# Patient Record
Sex: Male | Born: 1985 | Race: Black or African American | Hispanic: No | Marital: Single | Smoking: Never smoker
Health system: Southern US, Community
[De-identification: ages and names within clinical notes are randomized; demographics above are authoritative.]

---
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
## 2006-03-10 ENCOUNTER — Emergency Department (HOSPITAL_COMMUNITY): Admission: EM | Admit: 2006-03-10 | Discharge: 2006-03-11 | Payer: Self-pay | Admitting: Emergency Medicine

## 2013-05-01 ENCOUNTER — Encounter (HOSPITAL_COMMUNITY): Payer: Self-pay | Admitting: Emergency Medicine

## 2013-05-01 ENCOUNTER — Emergency Department (HOSPITAL_COMMUNITY)
Admission: EM | Admit: 2013-05-01 | Discharge: 2013-05-01 | Disposition: A | Discharge status: Home / Self Care | Payer: Self-pay | Attending: Emergency Medicine | Admitting: Emergency Medicine

## 2013-05-01 ENCOUNTER — Emergency Department (HOSPITAL_COMMUNITY): Payer: Self-pay

## 2013-05-01 DIAGNOSIS — R042 Hemoptysis: Secondary | ICD-10-CM

## 2013-05-01 DIAGNOSIS — J029 Acute pharyngitis, unspecified: Secondary | ICD-10-CM | POA: Insufficient documentation

## 2013-05-01 DIAGNOSIS — R110 Nausea: Secondary | ICD-10-CM | POA: Insufficient documentation

## 2013-05-01 DIAGNOSIS — R059 Cough, unspecified: Secondary | ICD-10-CM

## 2013-05-01 DIAGNOSIS — R0981 Nasal congestion: Secondary | ICD-10-CM

## 2013-05-01 DIAGNOSIS — R05 Cough: Secondary | ICD-10-CM

## 2013-05-01 MED ORDER — BENZONATATE 100 MG PO CAPS: 100.0000 mg | ORAL_CAPSULE | Freq: Three times a day (TID) | ORAL | Status: DC

## 2013-05-01 MED ORDER — SALINE SPRAY 0.65 % NA SOLN: 1.0000 | NASAL | Status: DC | PRN

## 2013-05-01 MED ORDER — GUAIFENESIN 100 MG/5ML PO LIQD: 100.0000 mg | ORAL | Status: DC | PRN

## 2013-05-01 NOTE — ED Provider Notes (Signed)
CSN: [id number]     Arrival date & time 05/01/13  1948 History  This chart was scribed for non-physician practitioner Junius Finner, working with Junius Argyle, MD by Carl Best, ED Scribe. This patient was seen in room TR09C/TR09C and the patient's care was started at 8:52 PM.    Chief Complaint  Patient presents with  . Hemoptysis    The history is provided by the patient. No language interpreter was used.   HPI Comments: Shawn Clarke is a 28 y.o. male who presents to the Emergency Department complaining of constant sore throat that started a week ago.  He states that he experienced mild hemoptysis earlier today.  The patient denies night sweats, facial pain, epistaxis, and vomiting as associated symptom.  He lists nausea as an associated symptom.  The patient states that he had a 100.5 degree fever today.  He states that he experiences chest pain when he coughs.  He states that he has observed some blood in his mucous when he blows his nose.  He denies taking any medication for his symptoms.  He denies sick contacts.  He denies having a history of Asthma.   Denies recent travel.   History reviewed. No pertinent past medical history. No past surgical history on file. No family history on file. History  Substance Use Topics  . Smoking status: Not on file  . Smokeless tobacco: Not on file  . Alcohol Use: Not on file    Review of Systems  Constitutional: Positive for fever (100.5).  HENT: Positive for sore throat.   Respiratory: Positive for cough.   Cardiovascular: Positive for chest pain (when coughing).  Gastrointestinal: Positive for nausea. Negative for vomiting.  All other systems reviewed and are negative.    Allergies  Review of patient's allergies indicates no known allergies.  Home Medications   Current Outpatient Rx  Name  Route  Sig  Dispense  Refill  . benzonatate (TESSALON) 100 MG capsule   Oral   Take 1 capsule (100 mg total) by mouth every 8  (eight) hours.   21 capsule   0   . guaiFENesin (ROBITUSSIN) 100 MG/5ML liquid   Oral   Take 5-10 mLs (100-200 mg total) by mouth every 4 (four) hours as needed for cough.   60 mL   0   . sodium chloride (OCEAN) 0.65 % SOLN nasal spray   Each Nare   Place 1 spray into both nostrils as needed for congestion.   1 Bottle   0     Triage Vitals: BP 120/71  Pulse 86  Temp(Src) 99.1 F (37.3 C) (Oral)  Resp 18  Ht 5\' 11"  (1.803 m)  Wt 160 lb (72.576 kg)  BMI 22.33 kg/m2  SpO2 98%  Physical Exam  Nursing note and vitals reviewed. Constitutional: He is oriented to person, place, and time. He appears well-developed and well-nourished. No distress.  Pt lying comfortably in exam bed, NAD.   HENT:  Head: Normocephalic and atraumatic.  Right Ear: Hearing, tympanic membrane, external ear and ear canal normal.  Left Ear: Hearing, tympanic membrane, external ear and ear canal normal.  Nose: Mucosal edema present. No epistaxis. Right sinus exhibits no maxillary sinus tenderness and no frontal sinus tenderness. Left sinus exhibits no maxillary sinus tenderness and no frontal sinus tenderness.  Mouth/Throat: Uvula is midline, oropharynx is clear and moist and mucous membranes are normal.  Eyes: Conjunctivae and EOM are normal. No scleral icterus.  Neck: Normal range of motion.  Neck supple. No tracheal deviation present.  Cardiovascular: Normal rate, regular rhythm and normal heart sounds.   Pulmonary/Chest: Effort normal and breath sounds normal. No respiratory distress. He has no wheezes. He has no rales. He exhibits no tenderness.  No respiratory distress, able to speak in full sentences w/o difficulty. Lungs: CTAB.  Intermittent dry cough.  Abdominal: Soft. Bowel sounds are normal. He exhibits no distension and no mass. There is no tenderness. There is no rebound and no guarding.  Musculoskeletal: Normal range of motion.  Neurological: He is alert and oriented to person, place, and time.   Skin: Skin is warm and dry.  Psychiatric: He has a normal mood and affect. His behavior is normal.    ED Course  Procedures (including critical care time)  DIAGNOSTIC STUDIES: Oxygen Saturation is 98% on room air, normal by my interpretation.    COORDINATION OF CARE: 8:54 PM- Discussed waiting for the x-ray results to rule out Pneumonia or Bronchitis.  The patient agreed to the treatment plan.    Labs Review Labs Reviewed - No data to display Imaging Review Dg Chest 2 View  05/01/2013   CLINICAL DATA:  Sore throat and headache  EXAM: CHEST  2 VIEW  COMPARISON:  None.  FINDINGS: The heart size and mediastinal contours are within normal limits. Both lungs are clear. Scoliosis deformity involves the thoracic and lumbar spine.  IMPRESSION: No active cardiopulmonary disease.   Electronically Signed   By: Signa Kellaylor  Stroud M.D.   On: 05/01/2013 21:13    EKG Interpretation   None       MDM   1. Cough   2. Nasal congestion   3. Cough with hemoptysis    Pt presenting with cough, nasal congestion and intermittent hemoptysis. Denies recent travel or sick contacts. Denies night sweats or hx of asthma. Lungs: CTAB, no respiratory distress.  CXR: unremarkable. Rx: tessalon, tobitussin, and nasal spay. Discussed use of humidifier at night as well as keeping well hydrated. F/u with PCP.  Return precautions provided. Pt verbalized understanding and agreement with tx plan.   I personally performed the services described in this documentation, which was scribed in my presence. The recorded information has been reviewed and is accurate.    Junius Finnerrin O'Malley, PA-C 05/01/13 2254

## 2013-05-01 NOTE — ED Notes (Signed)
Presents with sore throat and cough and fever, began Sunday. Reports fever of 100.5. Reports having 3 episodes of hemoptysis since Sunday. Reports sore throat is worse with swallowing. Reports blood when blowing nose as well.

## 2013-05-02 NOTE — ED Provider Notes (Signed)
Medical screening examination/treatment/procedure(s) were performed by non-physician practitioner and as supervising physician I was immediately available for consultation/collaboration.  EKG Interpretation   None         Cassadee Vanzandt S Aarin Sparkman, MD 05/02/13 0112 

## 2014-06-03 IMAGING — CR DG CHEST 2V
2 series · 2 of 2 positions shown · non-contrast
Comparison: None.

CLINICAL DATA: Sore throat and headache

EXAM:
CHEST  2 VIEW

[w chest pa]
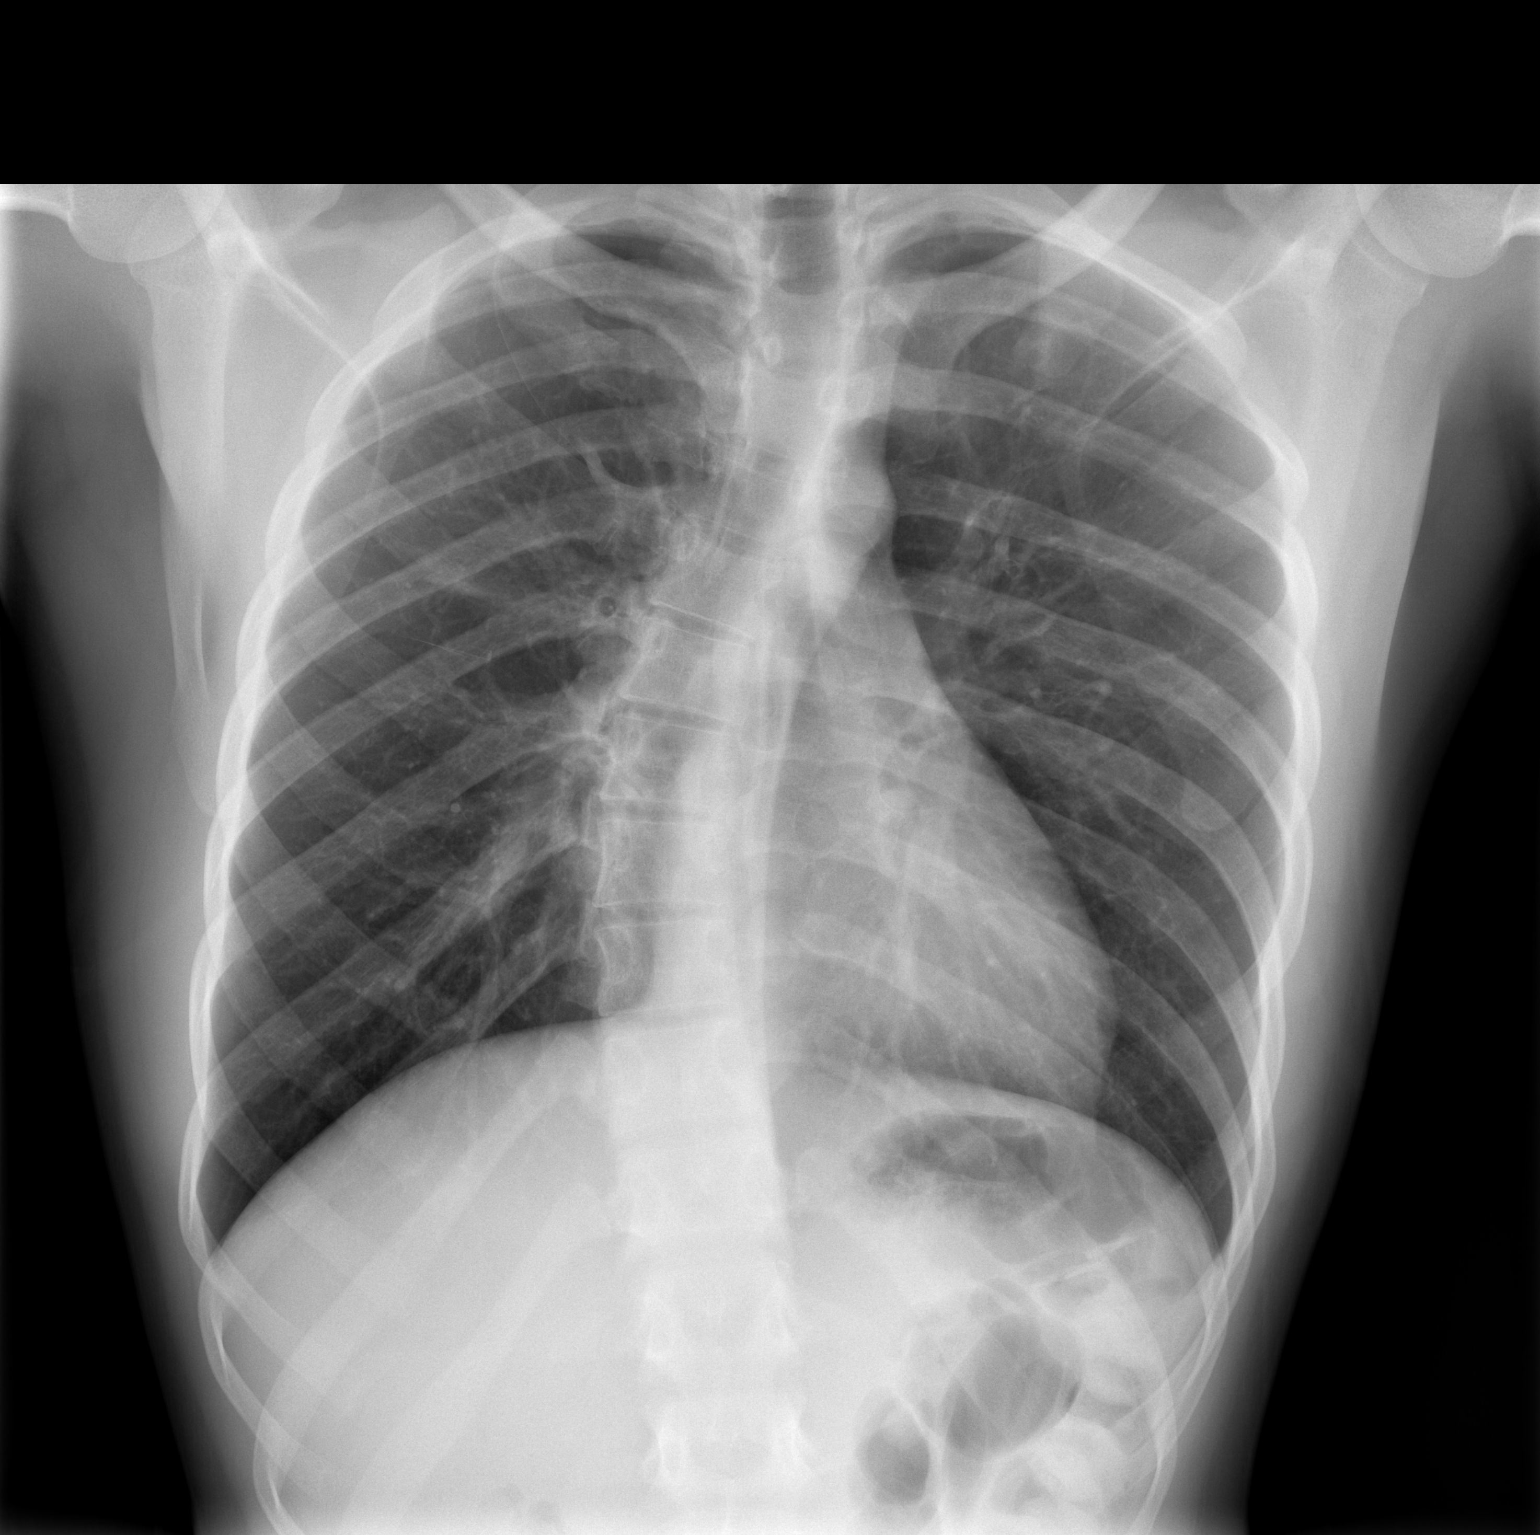

[w chest lat]
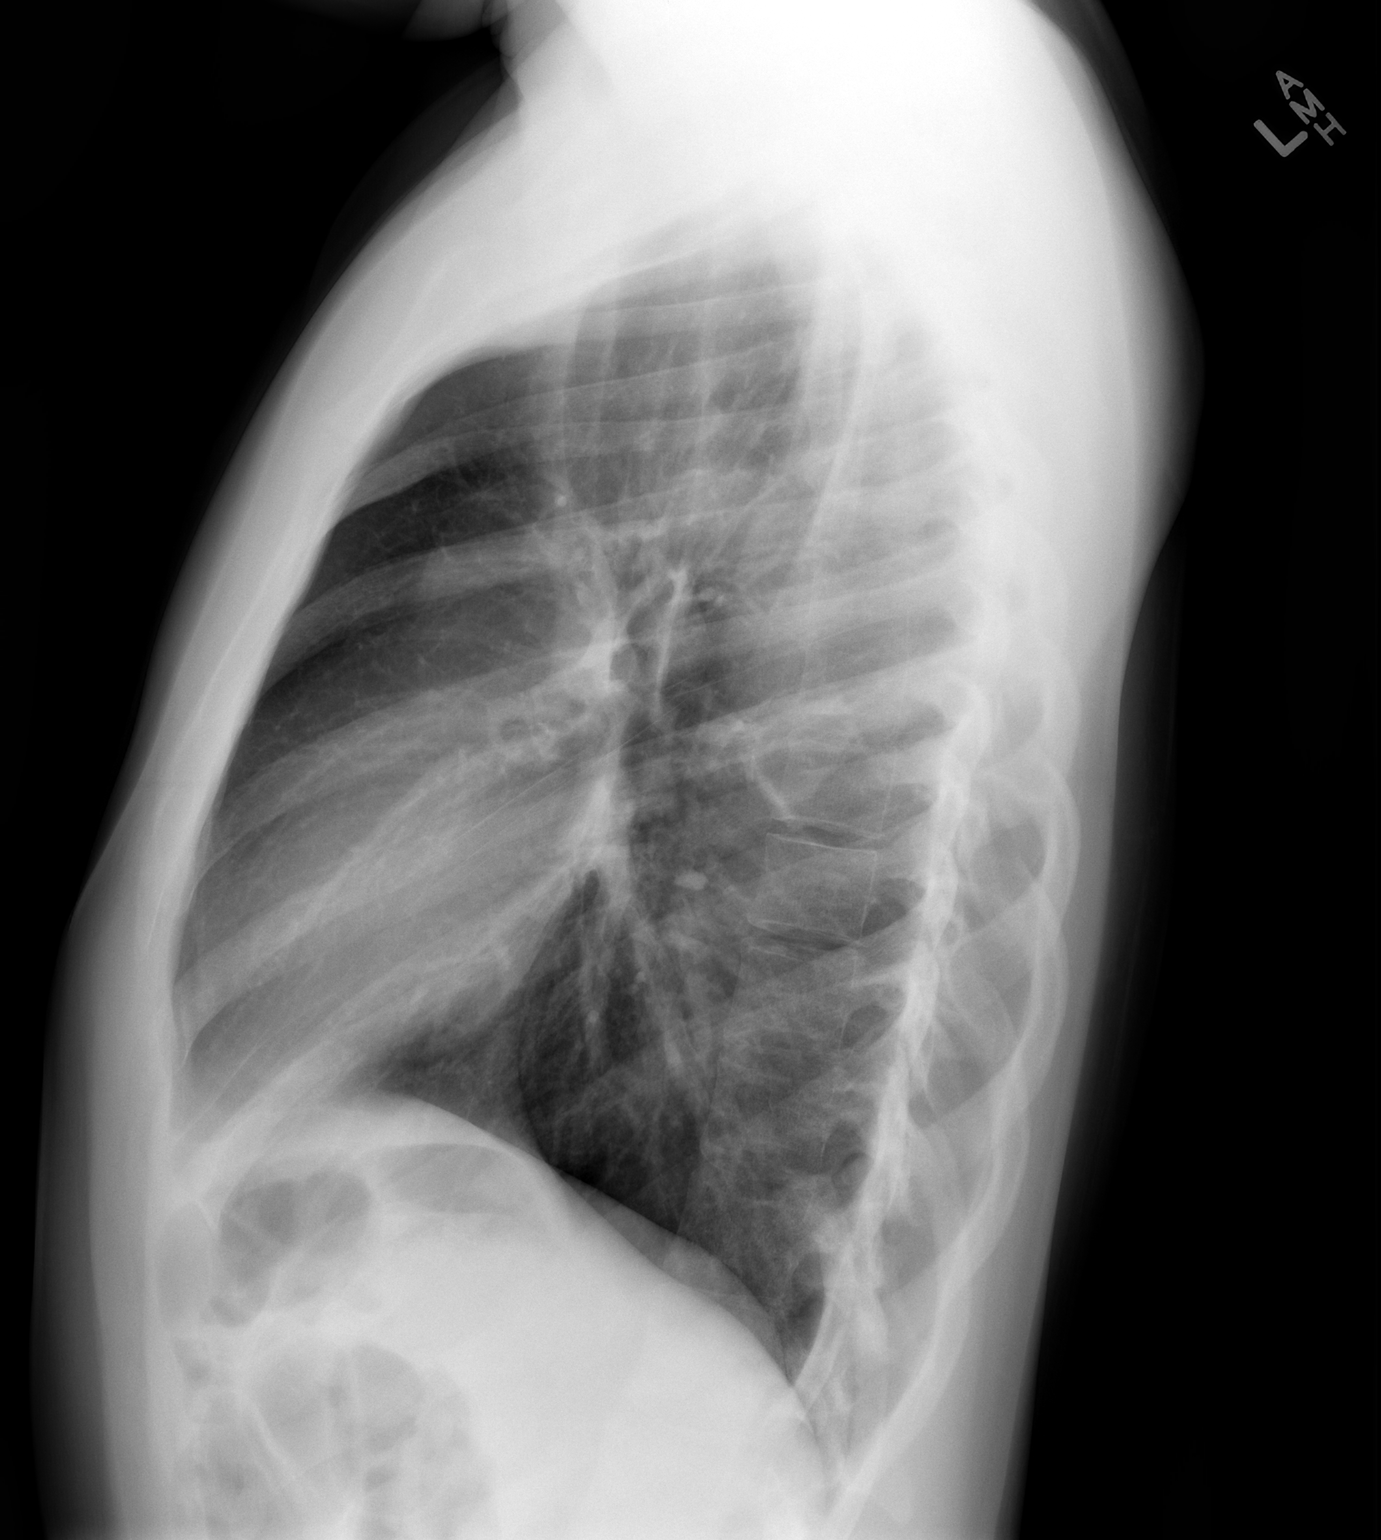

[2 of 2 positions shown; findings below may reference images not displayed]

FINDINGS: The heart size and mediastinal contours are within normal limits.
Both lungs are clear. Scoliosis deformity involves the thoracic and
lumbar spine.
IMPRESSION: No active cardiopulmonary disease.

## 2019-03-10 ENCOUNTER — Other Ambulatory Visit: Payer: Self-pay

## 2019-03-10 DIAGNOSIS — Z20822 Contact with and (suspected) exposure to covid-19: Secondary | ICD-10-CM

## 2019-03-12 LAB — NOVEL CORONAVIRUS, NAA: SARS-CoV-2, NAA: DETECTED — AB

## 2019-07-07 ENCOUNTER — Ambulatory Visit (HOSPITAL_COMMUNITY)
Admission: EM | Admit: 2019-07-07 | Discharge: 2019-07-07 | Disposition: A | Discharge status: Home / Self Care | Payer: BC Managed Care – PPO | Attending: Family Medicine | Admitting: Family Medicine

## 2019-07-07 ENCOUNTER — Encounter (HOSPITAL_COMMUNITY): Payer: Self-pay

## 2019-07-07 DIAGNOSIS — S161XXA Strain of muscle, fascia and tendon at neck level, initial encounter: Secondary | ICD-10-CM | POA: Diagnosis not present

## 2019-07-07 DIAGNOSIS — S60221A Contusion of right hand, initial encounter: Secondary | ICD-10-CM | POA: Diagnosis not present

## 2019-07-07 MED ORDER — IBUPROFEN 600 MG PO TABS: 600.0000 mg | ORAL_TABLET | Freq: Four times a day (QID) | ORAL | 0 refills | Status: AC | PRN

## 2019-07-07 MED ORDER — TIZANIDINE HCL 4 MG PO TABS: 4.0000 mg | ORAL_TABLET | Freq: Four times a day (QID) | ORAL | 0 refills | Status: DC | PRN
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
## 2019-09-27 ENCOUNTER — Ambulatory Visit: Payer: BC Managed Care – PPO | Admitting: Orthopaedic Surgery

## 2019-09-27 ENCOUNTER — Encounter: Payer: Self-pay | Admitting: Orthopaedic Surgery

## 2019-09-27 ENCOUNTER — Ambulatory Visit (INDEPENDENT_AMBULATORY_CARE_PROVIDER_SITE_OTHER): Payer: BC Managed Care – PPO

## 2019-09-27 VITALS — Ht 71.0 in | Wt 193.0 lb

## 2019-09-27 DIAGNOSIS — M79641 Pain in right hand: Secondary | ICD-10-CM
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
## 2019-10-19 ENCOUNTER — Ambulatory Visit (INDEPENDENT_AMBULATORY_CARE_PROVIDER_SITE_OTHER): Payer: BC Managed Care – PPO | Admitting: Orthopaedic Surgery

## 2019-10-19 ENCOUNTER — Encounter: Payer: Self-pay | Admitting: Orthopaedic Surgery

## 2019-10-19 DIAGNOSIS — M79641 Pain in right hand: Secondary | ICD-10-CM | POA: Diagnosis not present
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
## 2021-06-08 ENCOUNTER — Telehealth: Payer: Self-pay | Admitting: Hematology and Oncology

## 2021-06-08 NOTE — Telephone Encounter (Signed)
Scheduled appt per 3/2 referral. Pt is aware of appt date and time. Pt is aware to arrive 15 mins prior to appt time and to bring and updated insurance card. Pt is aware of appt location.   ?

## 2021-06-20 NOTE — Progress Notes (Signed)
[facility] ?CONSULT NOTE ? ?Patient Care Team: ?Norm Salt, PA as PCP - General (Physician Assistant) ? ?CHIEF COMPLAINTS/PURPOSE OF CONSULTATION: Persistent microcytosis/ hemoglobinopathy  ? ? ?HISTORY OF PRESENTING ILLNESS:  ?Shawn Clarke 36 y.o. male is here because of recent diagnosis of Persistent microcytosis hemoglobinopathy. He presents to the clinic today for evaluation and discussion.  He is in excellent health and is without any problems or concerns.  He has never had any history of anemia.  He does not remember any family members with blood disorders. ? ?I reviewed her records extensively and collaborated the history with the patient. ?  ? ?MEDICAL HISTORY:  ?No blood problems in the family ? ?SURGICAL HISTORY: ?No prior surgeries ? ?SOCIAL HISTORY: Occasional alcohol use but denies any tobacco or recreational drugs ?FAMILY HISTORY: ?Family History  ?Problem Relation Age of Onset  ? Diabetes Mother   ? Healthy Father   ? ? ?ALLERGIES:  has No Known Allergies. ? ?MEDICATIONS:  ?Current Outpatient Medications  ?Medication Sig Dispense Refill  ? ibuprofen (ADVIL) 600 MG tablet Take 1 tablet (600 mg total) by mouth every 6 (six) hours as needed. (Patient not taking: Reported on 09/27/2019) 30 tablet 0  ? tiZANidine (ZANAFLEX) 4 MG tablet Take 1-2 tablets (4-8 mg total) by mouth every 6 (six) hours as needed for muscle spasms. (Patient not taking: Reported on 09/27/2019) 21 tablet 0  ? ?No current facility-administered medications for this visit.  ? ? ?REVIEW OF SYSTEMS:   ?Constitutional: Denies fevers, chills or abnormal night sweats ?  ?All other systems were reviewed with the patient and are negative. ? ?PHYSICAL EXAMINATION: ?ECOG PERFORMANCE STATUS: 1 - Symptomatic but completely ambulatory ? ?Vitals:  ? 06/22/21 1546  ?BP: 138/82  ?Pulse: 78  ?Resp: 16  ?Temp: 98.1 ?F (36.7 ?C)  ?SpO2: 95%  ? ?Filed Weights  ? 06/22/21 1546  ?Weight: 210 lb 4.8 oz (95.4 kg)  ? ?    ? ?RADIOGRAPHIC STUDIES: ?I have personally reviewed the radiological reports and agreed with the findings in the report. ? 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 ? ?Plan: Check for hemoglobin electrophoresis and complete iron panel including ferritin ?If the electrophoresis is normal then we may have to do an alpha thalassemia genetic analysis. ? ?Telephone visit in 1 week to discuss results. ? ? ?All questions were answered. The patient knows to call the clinic with any problems, questions or concerns. ?  ? Tamsen Meek, MD ?06/22/21 ? I Janan Ridge, am acting as a scribe for Dr. Pamelia Hoit  ? ?I have reviewed the above documentation for accuracy and completeness, and I agree with the above. ? ?

## 2021-06-22 ENCOUNTER — Other Ambulatory Visit: Payer: Self-pay

## 2021-06-22 ENCOUNTER — Inpatient Hospital Stay: Payer: BC Managed Care – PPO | Attending: Hematology and Oncology | Admitting: Hematology and Oncology

## 2021-06-22 ENCOUNTER — Other Ambulatory Visit: Payer: Self-pay | Admitting: *Deleted

## 2021-06-22 ENCOUNTER — Inpatient Hospital Stay: Payer: BC Managed Care – PPO

## 2021-06-22 DIAGNOSIS — R718 Other abnormality of red blood cells: Secondary | ICD-10-CM | POA: Insufficient documentation

## 2021-06-22 LAB — IRON AND IRON BINDING CAPACITY (CC-WL,HP ONLY)
Iron: 49 ug/dL (ref 45–182)
Saturation Ratios: 14 % — ABNORMAL LOW (ref 17.9–39.5)
TIBC: 356 ug/dL (ref 250–450)
UIBC: 307 ug/dL (ref 117–376)

## 2021-06-22 NOTE — Assessment & Plan Note (Signed)
Lab review: ?05/24/2021: Iron saturation 30%, TIBC 323, hemoglobin 13.4, MCV 73.2, RDW 15.9 ?Microcytosis without any clear-cut anemia raises the suspicion that this is a hemoglobinopathy like beta thalassemia. ? ?Plan: Check for hemoglobin electrophoresis and complete iron panel including ferritin ?If the electrophoresis is normal then we may have to do an alpha thalassemia genetic analysis. ? ?Telephone visit in 1 week to discuss results. ?

## 2021-06-25 LAB — FERRITIN: Ferritin: 435 ng/mL — ABNORMAL HIGH (ref 24–336)

## 2021-06-26 LAB — HGB FRACTIONATION CASCADE
Hgb A2: 5.4 % — ABNORMAL HIGH (ref 1.8–3.2)
Hgb A: 92.5 % — ABNORMAL LOW (ref 96.4–98.8)
Hgb F: 2.1 % — ABNORMAL HIGH (ref 0.0–2.0)
Hgb S: 0 %

## 2021-06-29 NOTE — Progress Notes (Signed)
HEMATOLOGY-ONCOLOGY PROGRESS NOTE ? ?CHIEF COMPLAINTS/PURPOSE OF CONSULTATION: Persistent microcytosis/ hemoglobinopathy  ? ?History of Present Illness:  Shawn Clarke 36 y.o. male is here because of recent diagnosis of Persistent microcytosis hemoglobinopathy. He presents to the clinic today for discussion about his lab work.   ? ?REVIEW OF SYSTEMS:   ?Constitutional: Denies fevers, chills or abnormal weight loss ?All other systems were reviewed with the patient and are negative. ? ?Observations/Objective:  ?He feels reasonably well without any new problems or concerns ?  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 ?The elevated ferritin is an acute phase reactant and the patient does not need any additional work-up for hemochromatosis. ? ?Therefore patient can be seen on an as-needed basis. ? ? ? ?I discussed the assessment and treatment plan with the patient. The patient was provided an opportunity to ask questions and all were answered. The patient agreed with the plan and demonstrated an understanding of the instructions. The patient was advised to call back or seek an in-person evaluation if the symptoms worsen or if the condition fails to improve as anticipated.  ? ?I provided 15 minutes of non-face-to-face time during this encounter. Tamsen Meek, MD   ? ?I Janan Ridge am scribing for Dr. Pamelia Hoit ? ?I have reviewed the above documentation for accuracy and completeness, and I agree with the above. ?  ?

## 2021-07-02 ENCOUNTER — Other Ambulatory Visit: Payer: Self-pay

## 2021-07-02 ENCOUNTER — Inpatient Hospital Stay (HOSPITAL_BASED_OUTPATIENT_CLINIC_OR_DEPARTMENT_OTHER): Payer: BC Managed Care – PPO | Admitting: Hematology and Oncology

## 2021-07-02 DIAGNOSIS — R718 Other abnormality of red blood cells: Secondary | ICD-10-CM | POA: Diagnosis not present

## 2021-07-02 NOTE — Assessment & Plan Note (Signed)
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 ?The elevated ferritin is an acute phase reactant and the patient does not need any additional work-up for hemochromatosis. ? ?Therefore patient can be seen on an as-needed basis. ? ?

## 2022-03-23 ENCOUNTER — Ambulatory Visit
Admission: EM | Admit: 2022-03-23 | Discharge: 2022-03-23 | Disposition: A | Discharge status: Home / Self Care | Payer: BC Managed Care – PPO | Attending: Internal Medicine | Admitting: Internal Medicine

## 2022-03-23 DIAGNOSIS — Z7951 Long term (current) use of inhaled steroids: Secondary | ICD-10-CM | POA: Insufficient documentation

## 2022-03-23 DIAGNOSIS — R058 Other specified cough: Secondary | ICD-10-CM | POA: Insufficient documentation

## 2022-03-23 DIAGNOSIS — J069 Acute upper respiratory infection, unspecified: Secondary | ICD-10-CM | POA: Insufficient documentation

## 2022-03-23 DIAGNOSIS — Z79899 Other long term (current) drug therapy: Secondary | ICD-10-CM | POA: Insufficient documentation

## 2022-03-23 DIAGNOSIS — Z1152 Encounter for screening for COVID-19: Secondary | ICD-10-CM | POA: Diagnosis not present

## 2022-03-23 MED ORDER — FLUTICASONE PROPIONATE 50 MCG/ACT NA SUSP: 1.0000 | Freq: Every day | NASAL | 0 refills | Status: AC

## 2022-03-23 MED ORDER — BENZONATATE 100 MG PO CAPS: 100.0000 mg | ORAL_CAPSULE | Freq: Three times a day (TID) | ORAL | 0 refills | Status: AC | PRN

## 2022-03-23 MED ORDER — PROMETHAZINE-DM 6.25-15 MG/5ML PO SYRP: 5.0000 mL | ORAL_SOLUTION | Freq: Every evening | ORAL | 0 refills | Status: AC | PRN

## 2022-03-23 NOTE — Discharge Instructions (Signed)
You have a viral upper respiratory that should run its course and self resolve with symptomatic treatment as we discussed.  I have prescribed you 3 different medications to help alleviate symptoms.  Please be advised that Promethazine DM can cause drowsiness.  Follow-up if symptoms persist or worsen.

## 2022-03-23 NOTE — ED Triage Notes (Signed)
Pt presents to [facility] with co of cough congestion fevers for 2 days has been taking otc cold and flu medications

## 2022-03-23 NOTE — ED Provider Notes (Signed)
[facility]    CSN: [id number] Arrival date & time: 03/23/22  1444      History   Chief Complaint Chief Complaint  Patient presents with   Fever   URI    HPI Shawn Clarke is a 36 y.o. male.   Patient presents with 2-day history of cough and nasal congestion.  Tmax at home was 100.4.  His girlfriend has similar symptoms.  Has taken over-the-counter cold and flu medication with minimal to minimal improvement in symptoms.  Denies history of asthma or COPD and patient does not smoke cigarettes.  Denies chest pain, shortness of breath, sore throat, ear pain, nausea, vomiting, diarrhea, abdominal pain.   Fever URI   History reviewed. No pertinent past medical history.  Patient Active Problem List   Diagnosis Date Noted   Microcytosis 06/22/2021   Pain in right hand 10/19/2019    History reviewed. No pertinent surgical history.     Home Medications    Prior to Admission medications   Medication Sig Start Date End Date Taking? Authorizing Provider  albuterol (VENTOLIN HFA) 108 (90 Base) MCG/ACT inhaler Inhale 2 puffs into the lungs every 6 (six) hours as needed. 01/25/22  Yes [provider]  benzonatate (TESSALON) 100 MG capsule Take 1 capsule (100 mg total) by mouth every 8 (eight) hours as needed for cough. 03/23/22  Yes Jatoya Armbrister, Rolly Salter E, FNP  fluticasone (FLONASE) 50 MCG/ACT nasal spray Place 1 spray into both nostrils daily. 03/23/22  Yes Orian Figueira, Acie Fredrickson, FNP  promethazine-dextromethorphan (PROMETHAZINE-DM) 6.25-15 MG/5ML syrup Take 5 mLs by mouth at bedtime as needed for cough. 03/23/22  Yes Rayanne Padmanabhan, Rolly Salter E, FNP  rosuvastatin (CRESTOR) 10 MG tablet Take 10 mg by mouth daily. 10/19/21  Yes [provider]  ibuprofen (ADVIL) 600 MG tablet Take 1 tablet (600 mg total) by mouth every 6 (six) hours as needed. Patient not taking: Reported on 09/27/2019 07/07/19   Eustace Moore, MD  tiZANidine (ZANAFLEX) 4 MG tablet Take 1-2 tablets (4-8 mg  total) by mouth every 6 (six) hours as needed for muscle spasms. Patient not taking: Reported on 09/27/2019 07/07/19   Eustace Moore, MD  sodium chloride (OCEAN) 0.65 % SOLN nasal spray Place 1 spray into both nostrils as needed for congestion. 05/01/13 07/07/19  Lurene Shadow, PA-C    Family History Family History  Problem Relation Age of Onset   Diabetes Mother    Healthy Father     Social History Social History   Tobacco Use   Smoking status: Never   Smokeless tobacco: Never  Substance Use Topics   Alcohol use: Yes   Drug use: Never     Allergies   Patient has no known allergies.   Review of Systems Review of Systems Per HPI  Physical Exam Triage Vital Signs ED Triage Vitals  Enc Vitals Group     BP 03/23/22 1545 132/85     Pulse Rate 03/23/22 1544 81     Resp 03/23/22 1544 16     Temp 03/23/22 1544 98.4 F (36.9 C)     Temp Source 03/23/22 1544 Oral     SpO2 03/23/22 1544 98 %     Weight --      Height --      Head Circumference --      Peak Flow --      Pain Score 03/23/22 1544 0     Pain Loc --      Pain Edu? --  Excl. in GC? --    No data found.  Updated Vital Signs BP 132/85   Pulse 81   Temp 98.4 F (36.9 C) (Oral)   Resp 16   SpO2 98%   Visual Acuity Right Eye Distance:   Left Eye Distance:   Bilateral Distance:    Right Eye Near:   Left Eye Near:    Bilateral Near:     Physical Exam Constitutional:      General: He is not in acute distress.    Appearance: Normal appearance. He is not toxic-appearing or diaphoretic.  HENT:     Head: Normocephalic and atraumatic.     Right Ear: Tympanic membrane and ear canal normal.     Left Ear: Tympanic membrane and ear canal normal.     Nose: Congestion present.     Mouth/Throat:     Mouth: Mucous membranes are moist.     Pharynx: No posterior oropharyngeal erythema.  Eyes:     Extraocular Movements: Extraocular movements intact.     Conjunctiva/sclera: Conjunctivae normal.      Pupils: Pupils are equal, round, and reactive to light.  Cardiovascular:     Rate and Rhythm: Normal rate and regular rhythm.     Pulses: Normal pulses.     Heart sounds: Normal heart sounds.  Pulmonary:     Effort: Pulmonary effort is normal. No respiratory distress.     Breath sounds: Normal breath sounds. No stridor. No wheezing, rhonchi or rales.  Abdominal:     General: Abdomen is flat. Bowel sounds are normal.     Palpations: Abdomen is soft.  Musculoskeletal:        General: Normal range of motion.     Cervical back: Normal range of motion.  Skin:    General: Skin is warm and dry.  Neurological:     General: No focal deficit present.     Mental Status: He is alert and oriented to person, place, and time. Mental status is at baseline.  Psychiatric:        Mood and Affect: Mood normal.        Behavior: Behavior normal.      UC Treatments / Results  Labs (all labs ordered are listed, but only abnormal results are displayed) Labs Reviewed  RESP PANEL BY RT-PCR (FLU A&B, COVID) [id number]    EKG   Radiology No results found.  Procedures Procedures (including critical care time)  Medications Ordered in UC Medications - No data to display  Initial Impression / Assessment and Plan / UC Course  I have reviewed the triage vital signs and the nursing notes.  Pertinent labs & imaging results that were available during my care of the patient were reviewed by me and considered in my medical decision making (see chart for details).     *** Final Clinical Impressions(s) / UC Diagnoses   Final diagnoses:  Viral upper respiratory tract infection with cough     Discharge Instructions      You have a viral upper respiratory that should run its course and self resolve with symptomatic treatment as we discussed.  I have prescribed you 3 different medications to help alleviate symptoms.  Please be advised that Promethazine DM can cause drowsiness.  Follow-up if symptoms  persist or worsen.   ED Prescriptions     Medication Sig Dispense Auth. Provider   benzonatate (TESSALON) 100 MG capsule Take 1 capsule (100 mg total) by mouth every 8 (eight) hours as needed for  cough. 21 capsule Edan Juday, [place] E, [place]   fluticasone [facility]) 50 MCG/ACT nasal spray Place 1 spray into both nostrils daily. 16 g Ervin Knack E, [place]   promethazine-dextromethorphan (PROMETHAZINE-DM) 6.25-15 MG/5ML syrup Take 5 mLs by mouth at bedtime as needed for cough. 118 mL Gustavus Bryant, [place]      PDMP not reviewed this encounter.

## 2022-03-24 LAB — RESP PANEL BY RT-PCR (FLU A&B, COVID) ARPGX2
Influenza A by PCR: NEGATIVE
Influenza B by PCR: NEGATIVE
SARS Coronavirus 2 by RT PCR: NEGATIVE

## 2022-11-27 ENCOUNTER — Ambulatory Visit
Admission: EM | Admit: 2022-11-27 | Discharge: 2022-11-27 | Disposition: A | Discharge status: Home / Self Care | Payer: BC Managed Care – PPO | Attending: Physician Assistant | Admitting: Physician Assistant

## 2022-11-27 DIAGNOSIS — M5442 Lumbago with sciatica, left side: Secondary | ICD-10-CM | POA: Diagnosis not present

## 2022-11-27 MED ORDER — CYCLOBENZAPRINE HCL 10 MG PO TABS: 10.0000 mg | ORAL_TABLET | Freq: Two times a day (BID) | ORAL | 0 refills | Status: AC | PRN

## 2022-11-27 MED ORDER — PREDNISONE 20 MG PO TABS: 40.0000 mg | ORAL_TABLET | Freq: Every day | ORAL | 0 refills | Status: AC

## 2022-11-27 NOTE — ED Triage Notes (Signed)
"  I am having some lower left back pain shooting down my left leg". First noticed "last night". No injury known. No abd pain. Voiding "fine". Stools "normal".

## 2022-11-27 NOTE — ED Provider Notes (Signed)
Shawn Clarke    CSN: [id number] Arrival date & time: 11/27/22  1024      History   Chief Complaint Chief Complaint  Patient presents with   Back Pain    HPI Shawn Clarke is a 37 y.o. male.   Patient here today for evaluation of left low back pain that started yesterday.  He states that pain does radiate down his left leg at times.  He notes that certain positions worsen pain and certain positions improve pain.  He has not any known injury.  He denies any abdominal pain.  He has not had any blood in his urine and states his stools are normal.  He does not report treatment for symptoms.  The history is provided by the patient.  Back Pain Associated symptoms: no dysuria, no fever and no numbness     History reviewed. No pertinent past medical history.  Patient Active Problem List   Diagnosis Date Noted   Microcytosis 06/22/2021   Pain in right hand 10/19/2019    History reviewed. No pertinent surgical history.     Home Medications    Prior to Admission medications   Medication Sig Start Date End Date Taking? Authorizing Provider  cyclobenzaprine (FLEXERIL) 10 MG tablet Take 1 tablet (10 mg total) by mouth 2 (two) times daily as needed for muscle spasms. 11/27/22  Yes Tomi Bamberger, PA-C  predniSONE (DELTASONE) 20 MG tablet Take 2 tablets (40 mg total) by mouth daily with breakfast for 5 days. 11/27/22 12/02/22 Yes Tomi Bamberger, PA-C  rosuvastatin (CRESTOR) 10 MG tablet Take 10 mg by mouth daily. 10/19/21  Yes [provider]  albuterol (VENTOLIN HFA) 108 (90 Base) MCG/ACT inhaler Inhale 2 puffs into the lungs every 6 (six) hours as needed. 01/25/22   [provider]  benzonatate (TESSALON) 100 MG capsule Take 1 capsule (100 mg total) by mouth every 8 (eight) hours as needed for cough. 03/23/22   Gustavus Bryant, FNP  fluticasone (FLONASE) 50 MCG/ACT nasal spray Place 1 spray into both nostrils daily. 03/23/22   Gustavus Bryant, FNP   ibuprofen (ADVIL) 600 MG tablet Take 1 tablet (600 mg total) by mouth every 6 (six) hours as needed. Patient not taking: Reported on 09/27/2019 07/07/19   Eustace Moore, MD  promethazine-dextromethorphan (PROMETHAZINE-DM) 6.25-15 MG/5ML syrup Take 5 mLs by mouth at bedtime as needed for cough. 03/23/22   Gustavus Bryant, FNP  sodium chloride (OCEAN) 0.65 % SOLN nasal spray Place 1 spray into both nostrils as needed for congestion. 05/01/13 07/07/19  Lurene Shadow, PA-C    Family History Family History  Problem Relation Age of Onset   Diabetes Mother    Healthy Father     Social History Social History   Tobacco Use   Smoking status: Never   Smokeless tobacco: Never  Vaping Use   Vaping status: Never Used  Substance Use Topics   Alcohol use: Yes    Comment: Occassionally.   Drug use: Never     Allergies   Patient has no known allergies.   Review of Systems Review of Systems  Constitutional:  Negative for chills and fever.  Eyes:  Negative for discharge and redness.  Respiratory:  Negative for shortness of breath.   Gastrointestinal:  Negative for nausea and vomiting.  Genitourinary:  Negative for dysuria and hematuria.  Musculoskeletal:  Positive for back pain and myalgias.  Neurological:  Negative for numbness.     Physical Exam  Triage Vital Signs ED Triage Vitals  Encounter Vitals Group     BP 11/27/22 1054 110/70     Systolic BP Percentile --      Diastolic BP Percentile --      Pulse Rate 11/27/22 1054 70     Resp 11/27/22 1054 18     Temp 11/27/22 1054 98.8 F (37.1 C)     Temp Source 11/27/22 1054 Oral     SpO2 11/27/22 1054 98 %     Weight 11/27/22 1053 210 lb (95.3 kg)     Height 11/27/22 1053 5\' 11"  (1.803 m)     Head Circumference --      Peak Flow --      Pain Score 11/27/22 1051 8     Pain Loc --      Pain Education --      Exclude from Growth Chart --    No data found.  Updated Vital Signs BP 110/70 (BP Location: Left Arm)   Pulse 70    Temp 98.8 F (37.1 C) (Oral)   Resp 18   Ht 5\' 11"  (1.803 m)   Wt 210 lb (95.3 kg)   SpO2 98%   BMI 29.29 kg/m   Visual Acuity Right Eye Distance:   Left Eye Distance:   Bilateral Distance:    Right Eye Near:   Left Eye Near:    Bilateral Near:     Physical Exam Vitals and nursing note reviewed.  Constitutional:      General: He is not in acute distress.    Appearance: Normal appearance. He is not ill-appearing.  HENT:     Head: Normocephalic and atraumatic.  Eyes:     Conjunctiva/sclera: Conjunctivae normal.  Cardiovascular:     Rate and Rhythm: Normal rate.  Pulmonary:     Effort: Pulmonary effort is normal. No respiratory distress.  Musculoskeletal:     Comments: No tenderness to palpation to midline spine or across low back  Neurological:     Mental Status: He is alert.  Psychiatric:        Mood and Affect: Mood normal.        Behavior: Behavior normal.        Thought Content: Thought content normal.      UC Treatments / Results  Labs (all labs ordered are listed, but only abnormal results are displayed) Labs Reviewed - No data to display  EKG   Radiology No results found.  Procedures Procedures (including critical Clarke time)  Medications Ordered in UC Medications - No data to display  Initial Impression / Assessment and Plan / UC Course  I have reviewed the triage vital signs and the nursing notes.  Pertinent labs & imaging results that were available during my Clarke of the patient were reviewed by me and considered in my medical decision making (see chart for details).    Suspect likely sciatic pain given radiation down the leg.  Will treat with steroid burst as well as muscle relaxer.  Encouraged follow-up if no gradual improvement or with any further concerns.  Final Clinical Impressions(s) / UC Diagnoses   Final diagnoses:  Acute left-sided low back pain with left-sided sciatica   Discharge Instructions   None    ED Prescriptions      Medication Sig Dispense Auth. Provider   predniSONE (DELTASONE) 20 MG tablet Take 2 tablets (40 mg total) by mouth daily with breakfast for 5 days. 10 tablet Erma Pinto F, PA-C   cyclobenzaprine (  FLEXERIL) 10 MG tablet Take 1 tablet (10 mg total) by mouth 2 (two) times daily as needed for muscle spasms. 20 tablet Tomi Bamberger, PA-C      PDMP not reviewed this encounter.   Tomi Bamberger, PA-C 11/27/22 1138
